# Patient Record
Sex: Male | Born: 1960 | Race: White | Hispanic: No | Marital: Married | Smoking: Never smoker
Health system: Southern US, Community
[De-identification: ages and names within clinical notes are randomized; demographics above are authoritative.]

## PROBLEM LIST (undated history)

## (undated) DIAGNOSIS — M79602 Pain in left arm: Secondary | ICD-10-CM

## (undated) DIAGNOSIS — Z8249 Family history of ischemic heart disease and other diseases of the circulatory system: Secondary | ICD-10-CM

## (undated) HISTORY — DX: Pain in left arm: M79.602

## (undated) HISTORY — DX: Family history of ischemic heart disease and other diseases of the circulatory system: Z82.49

---

## 2009-07-17 ENCOUNTER — Observation Stay (HOSPITAL_COMMUNITY): Admission: EM | Admit: 2009-07-17 | Discharge: 2009-07-17 | Payer: Self-pay | Admitting: Emergency Medicine

## 2009-08-05 ENCOUNTER — Encounter (HOSPITAL_COMMUNITY): Admission: RE | Admit: 2009-08-05 | Discharge: 2009-10-07 | Payer: Self-pay | Admitting: Cardiology

## 2009-08-05 HISTORY — PX: NM MYOCAR PERF WALL MOTION: HXRAD629

## 2010-09-18 LAB — DIFFERENTIAL
Basophils Absolute: 0 10*3/uL (ref 0.0–0.1)
Basophils Relative: 0 % (ref 0–1)
Neutro Abs: 6 10*3/uL (ref 1.7–7.7)
Neutrophils Relative %: 65 % (ref 43–77)

## 2010-09-18 LAB — BASIC METABOLIC PANEL
CO2: 27 mEq/L (ref 19–32)
Calcium: 8.6 mg/dL (ref 8.4–10.5)
Creatinine, Ser: 0.98 mg/dL (ref 0.4–1.5)
GFR calc non Af Amer: >60 mL/min (ref >60)
Sodium: 136 mEq/L (ref 135–145)

## 2010-09-18 LAB — CBC
MCHC: 34.6 g/dL (ref 30.0–36.0)
RBC: 5.11 MIL/uL (ref 4.22–5.81)
RDW: 12.8 % (ref 11.5–15.5)

## 2010-09-18 LAB — POCT CARDIAC MARKERS
CKMB, poc: 1.5 ng/mL (ref 1.0–8.0)
CKMB, poc: <1 ng/mL — ABNORMAL LOW (ref 1.0–8.0)
Myoglobin, poc: 136 ng/mL (ref 12–200)
Troponin i, poc: <0.05 ng/mL (ref 0.00–0.09)

## 2010-09-18 LAB — CK TOTAL AND CKMB (NOT AT ARMC)
CK, MB: 1.7 ng/mL (ref 0.3–4.0)
Relative Index: INVALID (ref 0.0–2.5)

## 2010-09-18 LAB — CARDIAC PANEL(CRET KIN+CKTOT+MB+TROPI)
Relative Index: INVALID (ref 0.0–2.5)
Troponin I: 0.04 ng/mL (ref 0.00–0.06)

## 2011-06-01 IMAGING — CR DG CHEST 2V
2 series · 2 of 2 positions shown · non-contrast
Comparison: None

CLINICAL DATA: Chest pain

CHEST - 2 VIEW

[w chest pa]
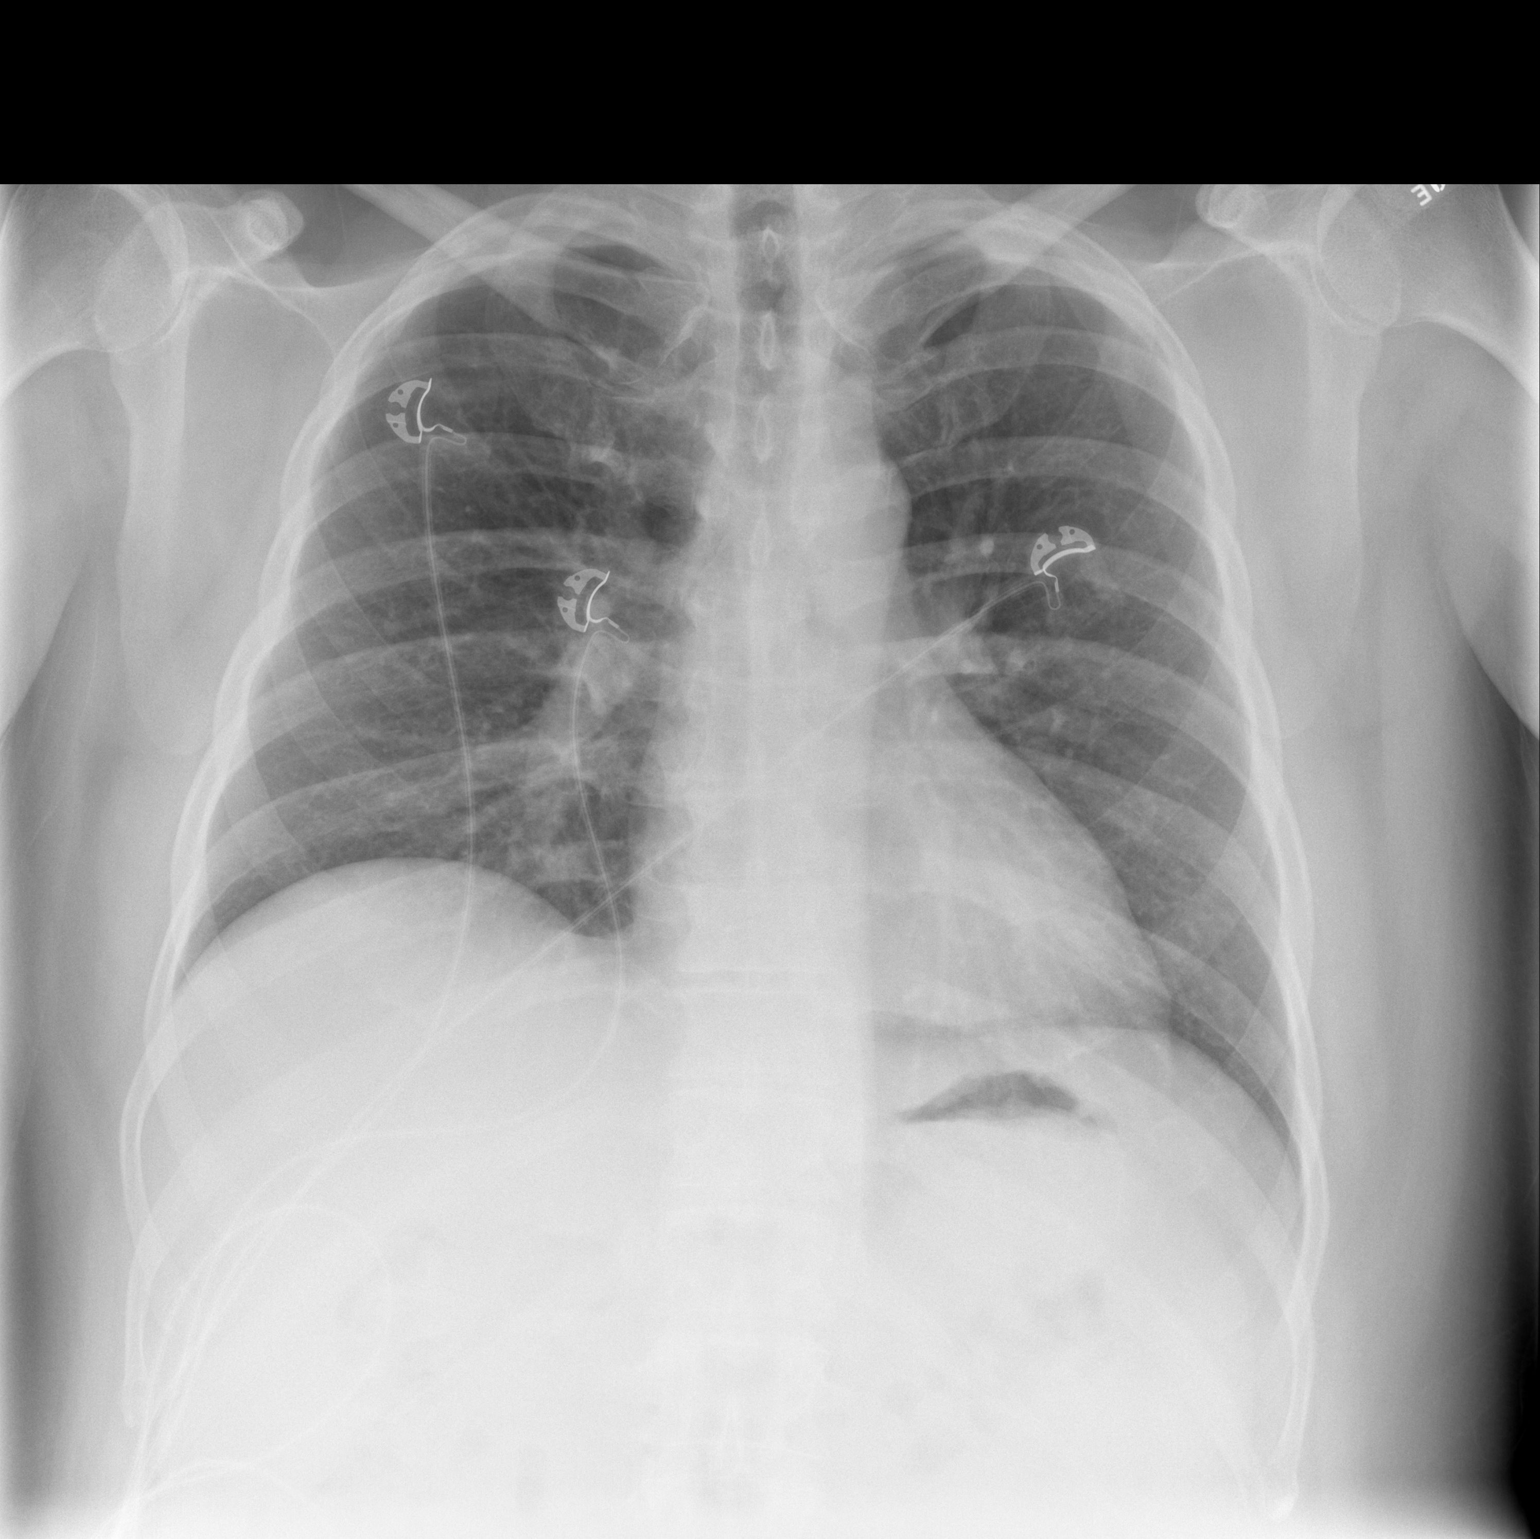

[w chest lat *]
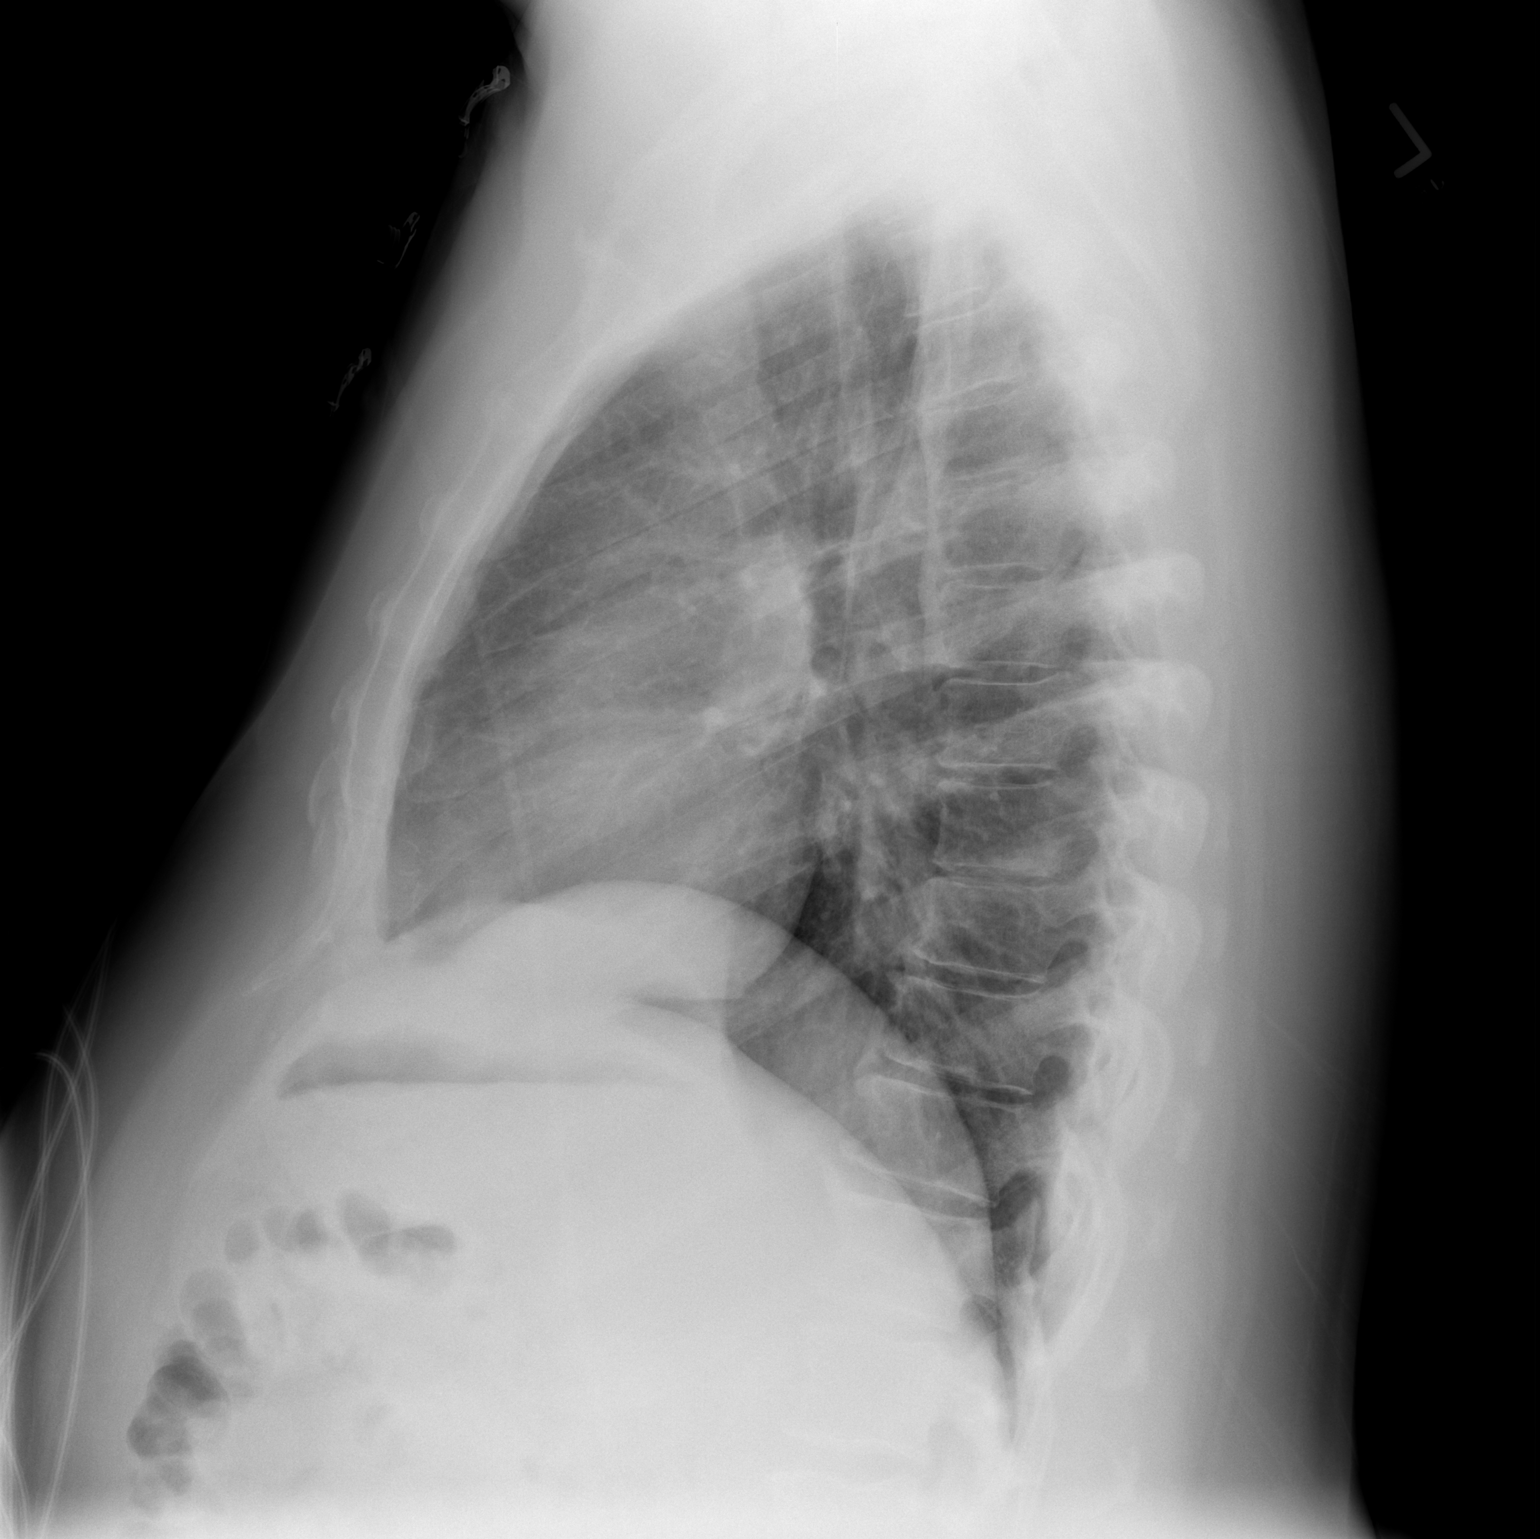

[2 of 2 positions shown; findings below may reference images not displayed]

FINDINGS: The heart size and mediastinal contours are within normal
limits.  Both lungs are clear.  The visualized skeletal structures
are unremarkable.
IMPRESSION: No acute findings.

## 2014-07-15 ENCOUNTER — Ambulatory Visit: Payer: Self-pay | Admitting: Cardiovascular Disease

## 2014-08-18 ENCOUNTER — Encounter: Payer: Self-pay | Admitting: Cardiovascular Disease

## 2014-08-18 ENCOUNTER — Ambulatory Visit (INDEPENDENT_AMBULATORY_CARE_PROVIDER_SITE_OTHER): Payer: Medicare Other | Admitting: Cardiovascular Disease

## 2014-08-18 VITALS — BP 138/86 | HR 76 | Ht 67.0 in | Wt 233.0 lb

## 2014-08-18 DIAGNOSIS — R079 Chest pain, unspecified: Secondary | ICD-10-CM

## 2014-08-18 DIAGNOSIS — M79602 Pain in left arm: Secondary | ICD-10-CM | POA: Insufficient documentation

## 2014-08-18 NOTE — Patient Instructions (Signed)
Your physician has requested that you have an exercise tolerance test. For further information please visit [email or web address]. Please also follow instruction sheet, as given.  Please follow up with Dr. Allyson SabalBerry as needed.

## 2014-08-18 NOTE — Progress Notes (Signed)
     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 He was referred by Austin BritainLisa Stevens, PA-C [facility] [place] for evaluation of episodic left upper extremity discomfort. His only cardiac risk factor is premature family history. Otherwise, he does not smoke nor is he diabetic, hypertensive or hyperlipidemic. He has never had a heart attack or stroke. He was evaluated for "acute coronary syndrome" by Austin Becker  in 2011 when he presented with chest pain and apparently had a normal outpatient stress test.   No current outpatient prescriptions on file.   No current facility-administered medications for this visit.    No Known Allergies  History   Social History  . Marital Status: Married    Spouse Name: N/A  . Number of Children: N/A  . Years of Education: N/A   Occupational History  . Not on file.   Social History Main Topics  . Smoking status: Never Smoker   . Smokeless tobacco: Not on file  . Alcohol Use: 0.6 - 1.2 oz/week    1-2 Standard drinks or equivalent per week  . Drug Use: Not on file  . Sexual Activity: Not on file   Other Topics Concern  . Not on file   Social History Narrative     Review of Systems: General: negative for chills, fever, night sweats or weight changes.  Cardiovascular: negative for chest pain, dyspnea on exertion, edema, orthopnea, palpitations, paroxysmal nocturnal dyspnea or shortness of breath Dermatological: negative for rash Respiratory: negative for cough or wheezing Urologic: negative for hematuria Abdominal: negative for nausea, vomiting, diarrhea, bright red blood per rectum, melena, or hematemesis Neurologic: negative for visual changes, syncope, or dizziness All  other systems reviewed and are otherwise negative except as noted above.    Blood pressure 138/86, pulse 76, height 5\' 7"  (1.702 m), weight 233 lb (105.688 kg).  General appearance: alert and no distress Neck: no adenopathy, no carotid bruit, no JVD, supple, symmetrical, trachea midline and thyroid not enlarged, symmetric, no tenderness/mass/nodules Lungs: clear to auscultation bilaterally Heart: regular rate and rhythm, S1, S2 normal, no murmur, click, rub or gallop Extremities: extremities normal, atraumatic, no cyanosis or edema  EKG normal sinus rhythm at 71 without ST or T-wave changes. I personally reviewed this EKG  ASSESSMENT AND PLAN:   Left arm pain Austin Becker has had episodic left shoulder and arm pain now for several months. He has a strong family history for heart disease but otherwise no significant cardiac risk factors. He did have a workup for "acute coronary syndrome" back in 2011 with an outpatient stress test that apparently was normal. I'm going to get a routine GXT risk stratify him and if this is negative we'll see him back as needed.       Austin GessJonathan J. Mandrell Vangilder Becker FACP,FACC,FAHA, [facility] HospitalFSCAI 08/18/2014 3:14 PM

## 2014-08-18 NOTE — Assessment & Plan Note (Signed)
Mr. Austin Becker has had episodic left shoulder and arm pain now for several months. He has a strong family history for heart disease but otherwise no significant cardiac risk factors. He did have a workup for "acute coronary syndrome" back in 2011 with an outpatient stress test that apparently was normal. I'm going to get a routine GXT risk stratify him and if this is negative we'll see him back as needed.

## 2014-09-11 ENCOUNTER — Telehealth (HOSPITAL_COMMUNITY): Payer: Self-pay

## 2014-09-11 NOTE — Telephone Encounter (Signed)
Encounter complete. 

## 2014-09-16 ENCOUNTER — Ambulatory Visit (HOSPITAL_COMMUNITY)
Admission: RE | Admit: 2014-09-16 | Discharge: 2014-09-16 | Disposition: A | Discharge status: Home / Self Care | Payer: Medicare Other | Source: Ambulatory Visit | Attending: Internal Medicine | Admitting: Internal Medicine

## 2014-09-16 ENCOUNTER — Encounter (HOSPITAL_COMMUNITY): Payer: Medicare Other

## 2014-09-16 DIAGNOSIS — R079 Chest pain, unspecified: Secondary | ICD-10-CM | POA: Diagnosis present

## 2014-09-16 NOTE — Procedures (Signed)
Exercise Treadmill Test   Test  Exercise Tolerance Test Ordering MD: Runell GessJonathan J.Berry, MD    Unique Test No: 1  Treadmill:  1  Indication for ETT: chest pain - rule out ischemia  Contraindication to ETT: No   Stress Modality: exercise - treadmill  Cardiac Imaging Performed: non   Protocol: standard Bruce - maximal  Max BP:  187/89  Max MPHR (bpm):  169 85% MPR (bpm):  143  MPHR obtained (bpm):  171 % MPHR obtained:  101  Reached 85% MPHR (min:sec):  2:15 Total Exercise Time (min-sec):  7  Workload in METS:  8.5 Borg Scale: 15  Reason ETT Terminated:  dyspnea    ST Segment Analysis At Rest: normal ST segments - no evidence of significant ST depression With Exercise: <1 mm upsloping inferior ST depression  Other Information Arrhythmia:  No Angina during ETT:  absent (0) Quality of ETT:  diagnostic  ETT Interpretation:  normal - no evidence of ischemia by ST analysis  Comments: <1 mm upsloping ST depression with exercise Good exercise tolerance No chest pain Normal BP response to exercise  Chrystie NoseKenneth C. Donat Humble, MD, [facility] OcalaFACC Attending Cardiologist [facility]

## 2014-09-21 ENCOUNTER — Encounter: Payer: Self-pay | Admitting: *Deleted

## 2020-07-16 ENCOUNTER — Other Ambulatory Visit: Payer: Medicare Other

## 2020-07-16 DIAGNOSIS — Z20822 Contact with and (suspected) exposure to covid-19: Secondary | ICD-10-CM

## 2020-07-20 LAB — NOVEL CORONAVIRUS, NAA: SARS-CoV-2, NAA: NOT DETECTED
